# Patient Record
Sex: Female | Born: 1970 | Race: White | Hispanic: No | Marital: Single | Smoking: Current every day smoker
Health system: Southern US, Community
[De-identification: ages and names within clinical notes are randomized; demographics above are authoritative.]

## PROBLEM LIST (undated history)

## (undated) HISTORY — PX: BACK SURGERY: SHX140

## (undated) HISTORY — PX: INCISION AND DRAINAGE PERIRECTAL ABSCESS: SHX1804

---

## 2017-02-21 ENCOUNTER — Emergency Department (HOSPITAL_COMMUNITY)
Admission: EM | Admit: 2017-02-21 | Discharge: 2017-02-21 | Disposition: A | Discharge status: Home / Self Care | Payer: Medicare Other | Attending: Emergency Medicine | Admitting: Emergency Medicine

## 2017-02-21 ENCOUNTER — Emergency Department (HOSPITAL_COMMUNITY): Payer: Medicare Other

## 2017-02-21 ENCOUNTER — Encounter (HOSPITAL_COMMUNITY): Payer: Self-pay | Admitting: *Deleted

## 2017-02-21 DIAGNOSIS — R42 Dizziness and giddiness: Secondary | ICD-10-CM | POA: Insufficient documentation

## 2017-02-21 DIAGNOSIS — R440 Auditory hallucinations: Secondary | ICD-10-CM | POA: Diagnosis not present

## 2017-02-21 DIAGNOSIS — H53149 Visual discomfort, unspecified: Secondary | ICD-10-CM | POA: Diagnosis not present

## 2017-02-21 DIAGNOSIS — R112 Nausea with vomiting, unspecified: Secondary | ICD-10-CM | POA: Insufficient documentation

## 2017-02-21 DIAGNOSIS — F17210 Nicotine dependence, cigarettes, uncomplicated: Secondary | ICD-10-CM | POA: Insufficient documentation

## 2017-02-21 DIAGNOSIS — R51 Headache: Secondary | ICD-10-CM | POA: Insufficient documentation

## 2017-02-21 DIAGNOSIS — R519 Headache, unspecified: Secondary | ICD-10-CM

## 2017-02-21 LAB — CBC WITH DIFFERENTIAL/PLATELET
Basophils Absolute: 0 10*3/uL (ref 0.0–0.1)
Basophils Relative: 0 %
Eosinophils Absolute: 0.1 10*3/uL (ref 0.0–0.7)
Eosinophils Relative: 1 %
HEMATOCRIT: 32.9 % — AB (ref 36.0–46.0)
HEMOGLOBIN: 11 g/dL — AB (ref 12.0–15.0)
LYMPHS ABS: 1.2 10*3/uL (ref 0.7–4.0)
LYMPHS PCT: 31 %
MCH: 32.5 pg (ref 26.0–34.0)
MCHC: 33.4 g/dL (ref 30.0–36.0)
MCV: 97.3 fL (ref 78.0–100.0)
Monocytes Absolute: 0.2 10*3/uL (ref 0.1–1.0)
Monocytes Relative: 5 %
NEUTROS ABS: 2.5 10*3/uL (ref 1.7–7.7)
Neutrophils Relative %: 63 %
Platelets: 309 10*3/uL (ref 150–400)
RBC: 3.38 MIL/uL — AB (ref 3.87–5.11)
RDW: 17.8 % — ABNORMAL HIGH (ref 11.5–15.5)
WBC: 3.9 10*3/uL — ABNORMAL LOW (ref 4.0–10.5)

## 2017-02-21 LAB — COMPREHENSIVE METABOLIC PANEL
ALK PHOS: 57 U/L (ref 38–126)
ALT: 11 U/L — AB (ref 14–54)
AST: 14 U/L — AB (ref 15–41)
Albumin: 3.4 g/dL — ABNORMAL LOW (ref 3.5–5.0)
Anion gap: 9 (ref 5–15)
BILIRUBIN TOTAL: 0.1 mg/dL — AB (ref 0.3–1.2)
BUN: 19 mg/dL (ref 6–20)
CALCIUM: 8.4 mg/dL — AB (ref 8.9–10.3)
CHLORIDE: 113 mmol/L — AB (ref 101–111)
CO2: 18 mmol/L — ABNORMAL LOW (ref 22–32)
CREATININE: 0.63 mg/dL (ref 0.44–1.00)
Glucose, Bld: 97 mg/dL (ref 65–99)
Potassium: 3.6 mmol/L (ref 3.5–5.1)
Sodium: 140 mmol/L (ref 135–145)
TOTAL PROTEIN: 6.5 g/dL (ref 6.5–8.1)

## 2017-02-21 MED ORDER — DIPHENHYDRAMINE HCL 50 MG/ML IJ SOLN
25.0000 mg | Freq: Once | INTRAMUSCULAR | Status: AC
  Administered 2017-02-21: 25 mg via INTRAVENOUS
  Filled 2017-02-21: qty 1

## 2017-02-21 MED ORDER — TRAMADOL HCL 50 MG PO TABS: 50.0000 mg | ORAL_TABLET | Freq: Four times a day (QID) | ORAL | 0 refills | Status: AC | PRN

## 2017-02-21 MED ORDER — METOCLOPRAMIDE HCL 5 MG/ML IJ SOLN
10.0000 mg | Freq: Once | INTRAMUSCULAR | Status: AC
  Administered 2017-02-21: 10 mg via INTRAVENOUS
  Filled 2017-02-21: qty 2

## 2017-02-21 MED ORDER — KETOROLAC TROMETHAMINE 30 MG/ML IJ SOLN
30.0000 mg | Freq: Once | INTRAMUSCULAR | Status: AC
  Administered 2017-02-21: 30 mg via INTRAVENOUS
  Filled 2017-02-21: qty 1

## 2017-02-21 NOTE — ED Notes (Signed)
Patient returned from CT

## 2017-02-21 NOTE — ED Notes (Signed)
Patient transported to CT 

## 2017-02-21 NOTE — ED Provider Notes (Signed)
[facility] Provider Note   CSN: [id number] Arrival date & time: 02/21/17  1334 By signing my name below, I, [name], attest that this documentation has been prepared under the direction and in the presence of Bethann Berkshire, MD. Electronically Signed: [name], ED Scribe. 02/21/17. 1:54 PM.  History   Chief Complaint Chief Complaint  Patient presents with  . Headache    HPI The history is provided by the patient. No language interpreter was used.  Headache   This is a new problem. The current episode started more than 2 days ago. The problem occurs constantly. The problem has been gradually worsening. The headache is associated with an unknown factor. The pain is located in the frontal region. The pain is at a severity of 9/10. The pain is moderate. The pain does not radiate. Associated symptoms include vomiting. Pertinent negatives include no fever. She has tried resting in a darkened room for the symptoms. The treatment provided mild relief.   HPI Comments: Amanda Delgado is a 46 y.o. female with h/o migaines, who presents to the Emergency Department complaining of frontal headache x 4 days with associated nausea, vomiting, light-headedness, and photophobia. Pt further reports she has also been having visual and auditory hallucinations that occur usually when her eyes are closed. She has taken her prescribed Xanax with very mild relief to her symptoms. Pt has h/o migraines and notes the last one she had was about a month ago. She states that her migraines usually resolve on their own but notes that this episode hasn't. She reports that although her headache at this time feels somewhat similar, she does not usually have hallucinations. Pt denies fever, chills, or any other associated symptoms.   History reviewed. No pertinent past medical history.  There are no active problems to display for this patient.   Past Surgical History:  Procedure Laterality Date  . BACK SURGERY    .  INCISION AND DRAINAGE PERIRECTAL ABSCESS      OB History    No data available       Home Medications    Prior to Admission medications   Not on File    Family History No family history on file.  Social History Social History  Substance Use Topics  . Smoking status: Current Every Day Smoker    Packs/day: 2.00    Types: Cigarettes  . Smokeless tobacco: Never Used  . Alcohol use No     Allergies   Patient has no known allergies.   Review of Systems Review of Systems  Constitutional: Negative for appetite change, chills, fatigue and fever.  HENT: Negative for congestion, ear discharge and sinus pressure.   Eyes: Positive for photophobia. Negative for discharge.  Respiratory: Negative for cough.   Cardiovascular: Negative for chest pain.  Gastrointestinal: Positive for vomiting. Negative for abdominal pain and diarrhea.  Genitourinary: Negative for frequency and hematuria.  Musculoskeletal: Negative for back pain.  Skin: Negative for rash.  Neurological: Positive for light-headedness and headaches. Negative for seizures.  Psychiatric/Behavioral: Positive for hallucinations.     Physical Exam Updated Vital Signs BP (!) 145/92 (BP Location: Right Arm)   Pulse 96   Temp 98.4 F (36.9 C) (Oral)   Resp 18   Ht  (1.651 m)   Wt 130 lb (59 kg)   SpO2 100%   BMI 21.63 kg/m   Physical Exam  Constitutional: She is oriented to person, place, and time. She appears well-developed.  HENT:  Head: Normocephalic.  Eyes: Conjunctivae and EOM are normal. No scleral icterus.  Neck: Neck supple. No thyromegaly present.  Cardiovascular: Normal rate and regular rhythm.  Exam reveals no gallop and no friction rub.   No murmur heard. Pulmonary/Chest: No stridor. She has no wheezes. She has no rales. She exhibits no tenderness.  Crackles bilaterally.  Abdominal: She exhibits no distension. There is no tenderness. There is no rebound.  Musculoskeletal: Normal range of  motion. She exhibits no edema.  Lymphadenopathy:    She has no cervical adenopathy.  Neurological: She is oriented to person, place, and time. She exhibits normal muscle tone. Coordination normal.  Skin: No rash noted. No erythema.  Psychiatric: She has a normal mood and affect. Her behavior is normal.  Nursing note and vitals reviewed.    ED Treatments / Results  DIAGNOSTIC STUDIES: Oxygen Saturation is 100% on RA, normal by my interpretation.   COORDINATION OF CARE: 1:52 PM-Discussed next steps with pt. Pt verbalized understanding and is agreeable with the plan.   Labs (all labs ordered are listed, but only abnormal results are displayed) Labs Reviewed - No data to display  EKG  EKG Interpretation None       Radiology No results found.  Procedures Procedures (including critical care time)  Medications Ordered in ED Medications - No data to display   Initial Impression / Assessment and Plan / ED Course  I have reviewed the triage vital signs and the nursing notes.  Pertinent labs & imaging results that were available during my care of the patient were reviewed by me and considered in my medical decision making (see chart for details).     Patient with a migraine headache that improved with migraine cocktail she'll be discharged home with some Ultram  Final Clinical Impressions(s) / ED Diagnoses   Final diagnoses:  None    New Prescriptions New Prescriptions   No medications on file  The chart was scribed for me under my direct supervision.  I personally performed the history, physical, and medical decision making and all procedures in the evaluation of this patient.Bethann Berkshire, MD 02/21/17 [phone]

## 2017-02-21 NOTE — Discharge Instructions (Signed)
Follow up if any problems °

## 2017-02-21 NOTE — ED Triage Notes (Signed)
Pt comes in with a migraine starting 4 days ago. Pt states she has hx of the same. Light and sound sensitivity is present. Pt states she feels lightheaded NAD noted.

## 2017-09-20 DEATH — deceased

## 2018-01-21 IMAGING — CT CT HEAD W/O CM
3 series · 16 of 47 positions shown, 19 images · non-contrast
Comparison: None.

CLINICAL DATA: Headaches for 2-3 days

EXAM:
CT HEAD WITHOUT CONTRAST
TECHNIQUE: Contiguous axial images were obtained from the base of the skull
through the vertex without intravenous contrast.

[Series 2: head wo · axial · 0.40mm/px · z∈[+1646,+1781]mm · 10 of 33 slices shown, 13 images]
[im 3/33  brain]
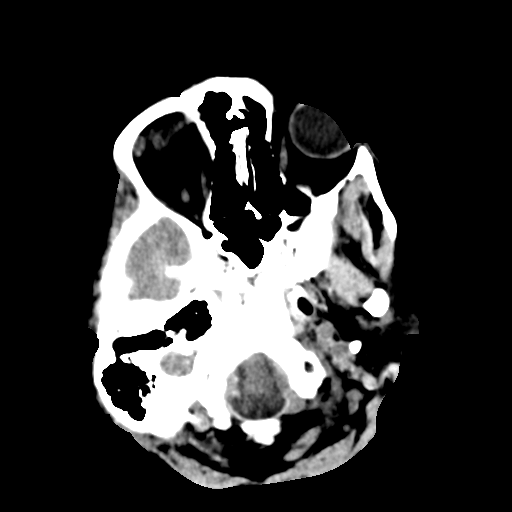
[im 3/33  bone]
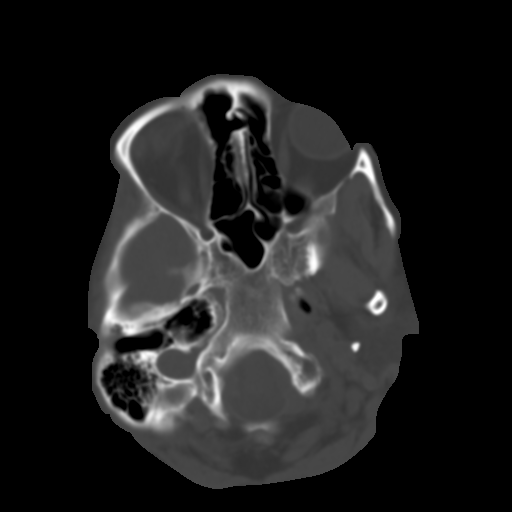
[im 6/33  brain]
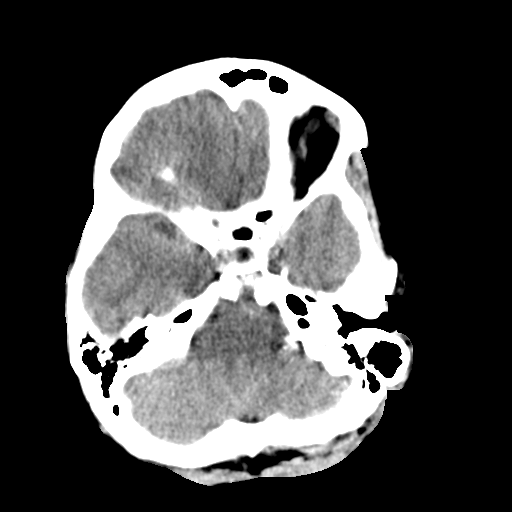
[im 9/33  brain]
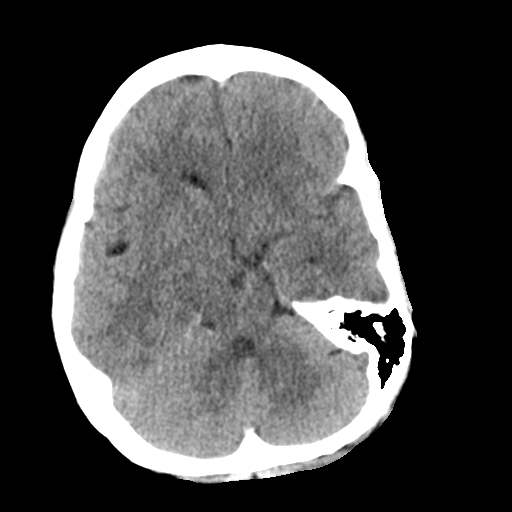
[im 12/33  brain]
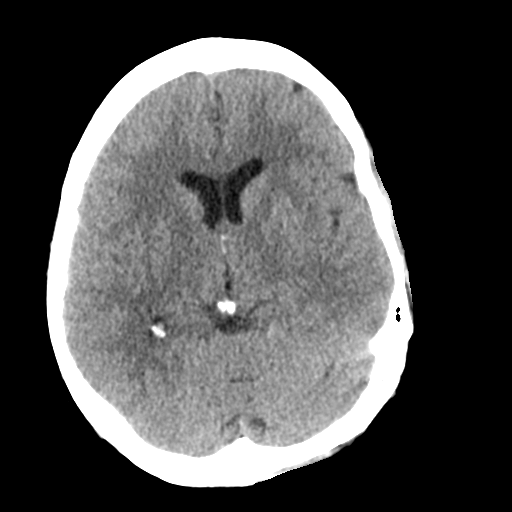
[im 15/33  brain]
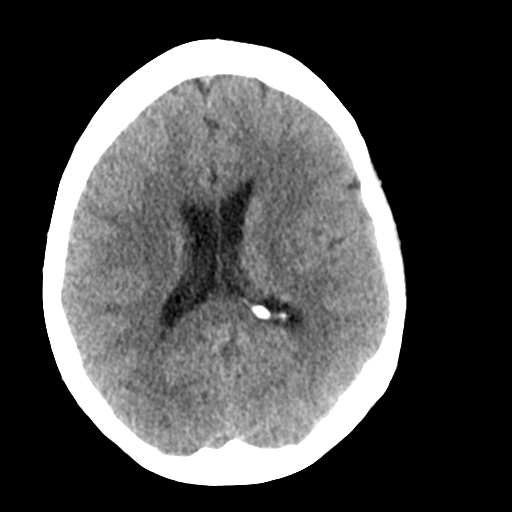
[im 15/33  bone]
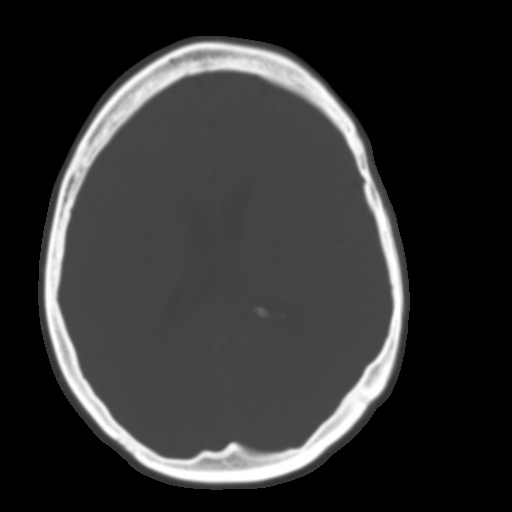
[im 18/33  brain]
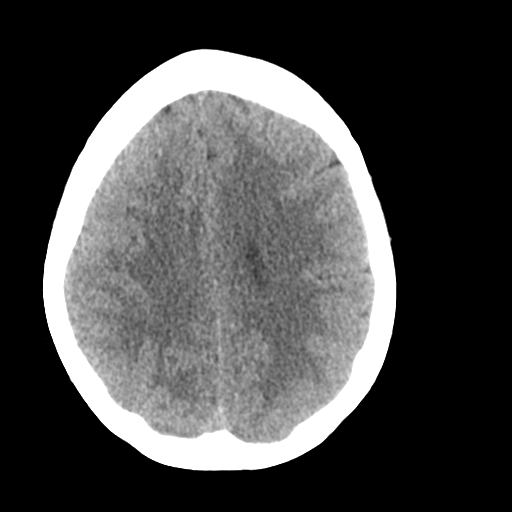
[im 21/33  brain]
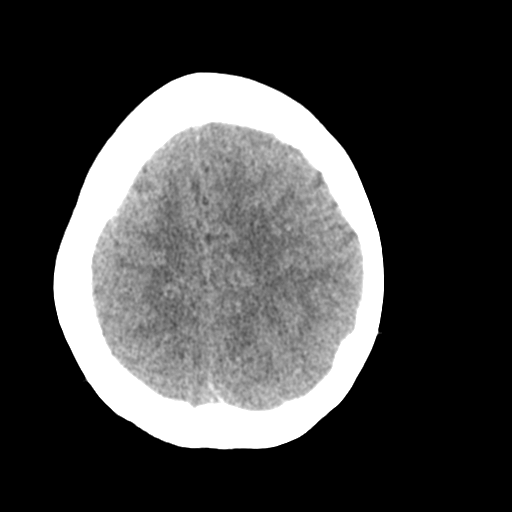
[im 25/33  brain]
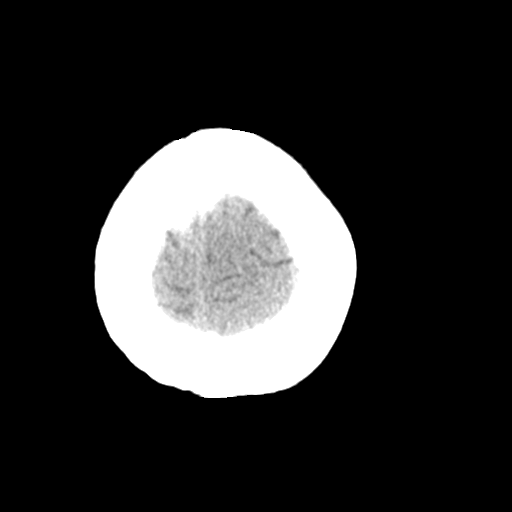
[im 27/33  brain]
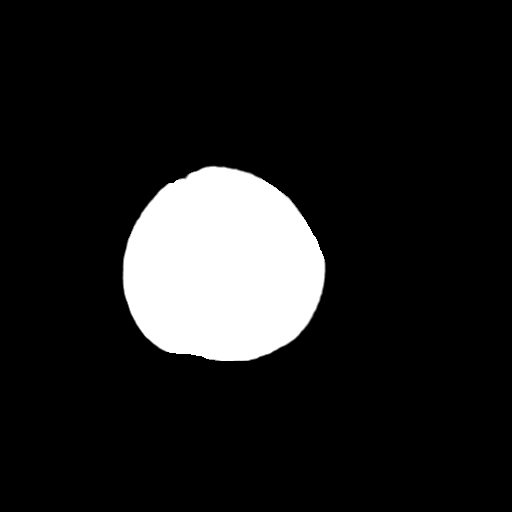
[im 27/33  bone]
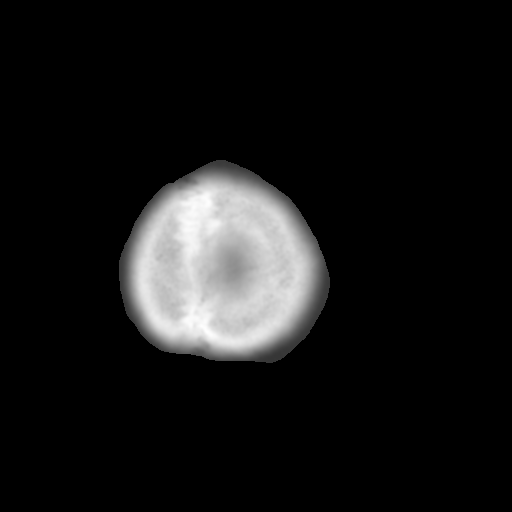
[im 30/33  brain]
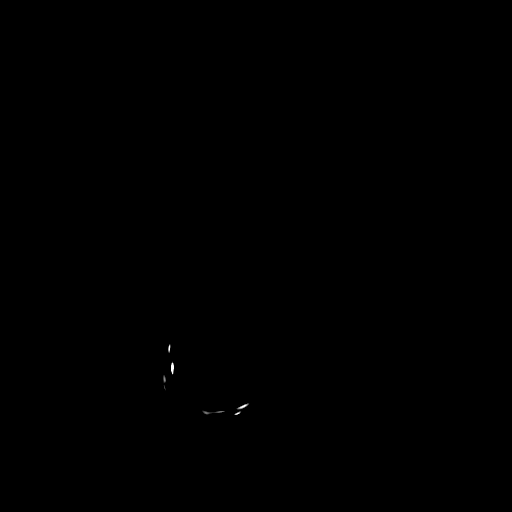

[Series 4: coronal soft tissue · coronal · 0.33mm/px · 3 of 67 slices shown]
[im 23/67  brain]
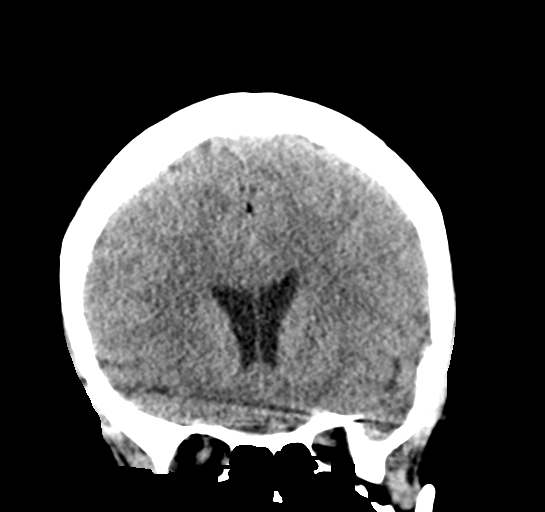
[im 30/67  brain]
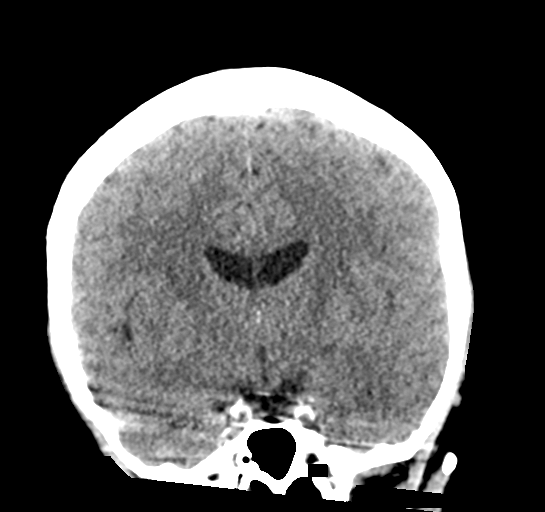
[im 37/67  brain]
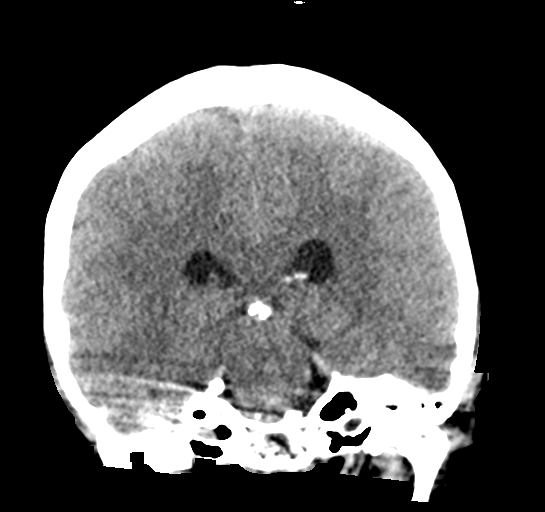

[Series 5: sagittal soft tissue · sagittal · 0.37mm/px · 3 of 57 slices shown]
[im 22/57  brain]
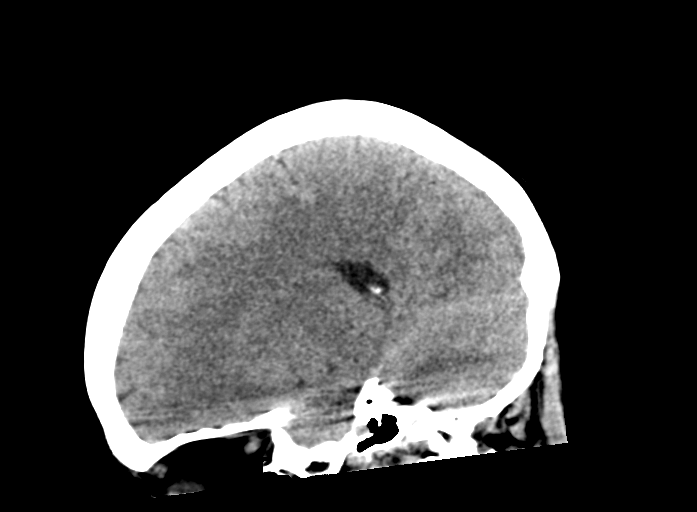
[im 29/57  brain]
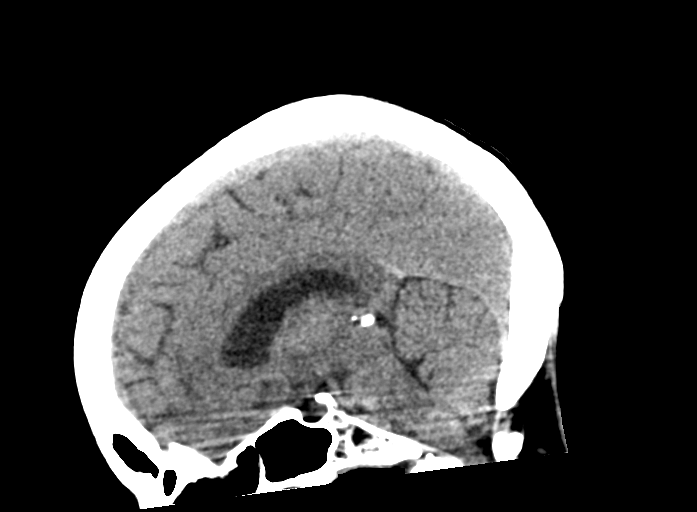
[im 36/57  brain]
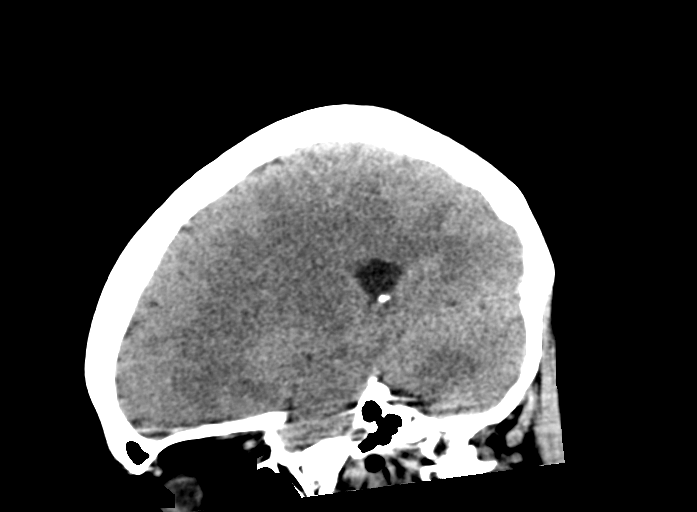

[16 of 47 positions shown; findings below may reference images not displayed]

FINDINGS: Brain: No evidence of acute infarction, hemorrhage, hydrocephalus,
extra-axial collection or mass lesion/mass effect.

Vascular: No hyperdense vessel or unexpected calcification.

Skull: No osseous abnormality.

Sinuses/Orbits: Visualized paranasal sinuses are clear. Visualized
mastoid sinuses are clear. Visualized orbits demonstrate no focal
abnormality.

Other: None
IMPRESSION: 1. No acute intracranial pathology.
